# Patient Record
Sex: Male | Born: 1980 | Race: White | Hispanic: No | Marital: Married | Smoking: Current every day smoker
Health system: Southern US, Community
[De-identification: ages and names within clinical notes are randomized; demographics above are authoritative.]

## PROBLEM LIST (undated history)

## (undated) HISTORY — PX: MYRINGOTOMY: SUR874

---
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
## 2003-07-01 ENCOUNTER — Emergency Department (HOSPITAL_COMMUNITY): Admission: EM | Admit: 2003-07-01 | Discharge: 2003-07-01 | Payer: Self-pay | Admitting: Emergency Medicine

## 2003-09-18 ENCOUNTER — Emergency Department (HOSPITAL_COMMUNITY): Admission: EM | Admit: 2003-09-18 | Discharge: 2003-09-18 | Payer: Self-pay | Admitting: Family Medicine

## 2004-06-30 ENCOUNTER — Inpatient Hospital Stay (HOSPITAL_COMMUNITY): Admission: EM | Admit: 2004-06-30 | Discharge: 2004-07-02 | Payer: Self-pay | Admitting: Psychiatry

## 2004-06-30 ENCOUNTER — Encounter: Payer: Self-pay | Admitting: *Deleted

## 2004-06-30 ENCOUNTER — Ambulatory Visit: Payer: Self-pay | Admitting: Psychiatry

## 2004-07-09 ENCOUNTER — Emergency Department (HOSPITAL_COMMUNITY): Admission: EM | Admit: 2004-07-09 | Discharge: 2004-07-09 | Payer: Self-pay | Admitting: Emergency Medicine

## 2007-01-01 ENCOUNTER — Emergency Department (HOSPITAL_COMMUNITY): Admission: EM | Admit: 2007-01-01 | Discharge: 2007-01-01 | Payer: Self-pay | Admitting: Family Medicine

## 2007-01-09 ENCOUNTER — Emergency Department (HOSPITAL_COMMUNITY): Admission: EM | Admit: 2007-01-09 | Discharge: 2007-01-09 | Payer: Self-pay | Admitting: Emergency Medicine

## 2007-04-16 ENCOUNTER — Emergency Department (HOSPITAL_COMMUNITY): Admission: EM | Admit: 2007-04-16 | Discharge: 2007-04-16 | Payer: Self-pay | Admitting: Emergency Medicine

## 2008-04-22 ENCOUNTER — Emergency Department (HOSPITAL_COMMUNITY): Admission: EM | Admit: 2008-04-22 | Discharge: 2008-04-22 | Payer: Self-pay | Admitting: Emergency Medicine

## 2009-05-29 ENCOUNTER — Emergency Department (HOSPITAL_COMMUNITY): Admission: EM | Admit: 2009-05-29 | Discharge: 2009-05-30 | Payer: Self-pay | Admitting: Emergency Medicine

## 2010-11-05 LAB — URINALYSIS, ROUTINE W REFLEX MICROSCOPIC
Bilirubin Urine: NEGATIVE
Hgb urine dipstick: NEGATIVE
Ketones, ur: NEGATIVE mg/dL
Nitrite: NEGATIVE
Protein, ur: NEGATIVE mg/dL
Urobilinogen, UA: 0.2 mg/dL (ref 0.0–1.0)

## 2010-11-05 LAB — BASIC METABOLIC PANEL
Calcium: 9.7 mg/dL (ref 8.4–10.5)
GFR calc Af Amer: >60 mL/min (ref >60)
GFR calc non Af Amer: >60 mL/min (ref >60)
Glucose, Bld: 91 mg/dL (ref 70–99)
Potassium: 3.7 mEq/L (ref 3.5–5.1)
Sodium: 136 mEq/L (ref 135–145)

## 2010-11-05 LAB — CBC
Hemoglobin: 15.7 g/dL (ref 13.0–17.0)
RBC: 5.53 MIL/uL (ref 4.22–5.81)
RDW: 14.1 % (ref 11.5–15.5)
WBC: 7.5 10*3/uL (ref 4.0–10.5)

## 2010-11-05 LAB — DIFFERENTIAL
Basophils Absolute: 0 10*3/uL (ref 0.0–0.1)
Lymphocytes Relative: 24 % (ref 12–46)
Lymphs Abs: 1.8 10*3/uL (ref 0.7–4.0)
Monocytes Absolute: 0.7 10*3/uL (ref 0.1–1.0)
Neutro Abs: 4.9 10*3/uL (ref 1.7–7.7)

## 2010-11-05 LAB — ETHANOL: Alcohol, Ethyl (B): <5 mg/dL (ref 0–10)

## 2010-11-05 LAB — RAPID URINE DRUG SCREEN, HOSP PERFORMED
Amphetamines: NOT DETECTED
Tetrahydrocannabinol: POSITIVE — AB
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
## 2011-02-21 ENCOUNTER — Encounter: Payer: Self-pay | Admitting: *Deleted

## 2011-02-21 DIAGNOSIS — L0201 Cutaneous abscess of face: Secondary | ICD-10-CM | POA: Insufficient documentation

## 2011-02-21 DIAGNOSIS — L03211 Cellulitis of face: Secondary | ICD-10-CM | POA: Insufficient documentation

## 2011-02-21 NOTE — ED Notes (Signed)
Pt states that he has ?cysts to his neck and jaw area. Reddened area to right side of nose. Painful. CP x 1 month. BP fluctuating.

## 2011-02-22 ENCOUNTER — Encounter (HOSPITAL_BASED_OUTPATIENT_CLINIC_OR_DEPARTMENT_OTHER): Payer: Self-pay | Admitting: *Deleted

## 2011-02-22 ENCOUNTER — Emergency Department (HOSPITAL_BASED_OUTPATIENT_CLINIC_OR_DEPARTMENT_OTHER)
Admission: EM | Admit: 2011-02-22 | Discharge: 2011-02-22 | Disposition: A | Discharge status: Home / Self Care | Payer: Self-pay | Attending: Emergency Medicine | Admitting: Emergency Medicine

## 2011-02-22 DIAGNOSIS — L0201 Cutaneous abscess of face: Secondary | ICD-10-CM

## 2011-02-22 MED ORDER — DOXYCYCLINE HYCLATE 100 MG PO CAPS: 100.0000 mg | ORAL_CAPSULE | Freq: Two times a day (BID) | ORAL | Status: AC

## 2011-02-22 MED ORDER — DOXYCYCLINE HYCLATE 100 MG PO TABS
100.0000 mg | ORAL_TABLET | Freq: Once | ORAL | Status: AC
  Administered 2011-02-22: 100 mg via ORAL
  Filled 2011-02-22: qty 1

## 2011-02-22 NOTE — ED Provider Notes (Signed)
History     Chief Complaint  Patient presents with  . Abscess   The history is provided by the patient.   this is a 30 year old white male with a several day history of an abscess on the right side of his nose. It is accompanied by tender lymph nodes of the right chin and right anterior cervical regions. He has a history of oily skin and nipples but this abscess is worse than usual. Moderately tender to palpation. He has not taken any medication to treat it.   History reviewed. No pertinent past medical history.  History reviewed. No pertinent past surgical history.  History reviewed. No pertinent family history.  History  Substance Use Topics  . Smoking status: Current Everyday Smoker -- 1.0 packs/day  . Smokeless tobacco: Not on file  . Alcohol Use: Yes      Review of Systems   positive for erectile dysfunction, occasional hypertension as measured at home, a two-month history of intermittent fleeting sharp left lower chest pains lasting several seconds and acid reflux; other systems reviewed are negative  Physical Exam  BP 126/76  Pulse 84  Temp(Src) 98.6 F (37 C) (Oral)  Resp 20  Ht 5\' 10"  (1.778 m)  Wt 248 lb (112.492 kg)  BMI 35.58 kg/m2  SpO2 98%  Physical Exam General: Well-developed, well-nourished male in no acute distress; appearance consistent with age of record HENT: normocephalic, atraumatic Eyes: pupils equal round and reactive to light; extraocular muscles intact Neck: supple Heart: regular rate and rhythm; no murmurs, rubs or gallops Lungs: clear to auscultation bilaterally Abdomen: soft; nontender; nondistended; no masses or hepatosplenomegaly; bowel sounds present Extremities: No deformity; full range of motion; pulses normal Neurologic: Awake, alert and oriented;motor function intact in all extremities and symmetric;sensation grossly intact; no facial droop Skin: Warm and dry; early abscess right side of nose; nonfluctuant,  non-pointing Psychiatric: Normal mood and affect Lymph: Tender large lymph nodes of the right chin and right submandibular regions   ED Course  Procedures  MDM Abscess not ready for incision and drainage; will start patient on doxycycline and have her return in 48 hours if not improving. Patient was advised of importance of a primary care physician and that issue such as erectile dysfunction and blood pressure best managed by a PCP.      Hanley Seamen, MD 02/22/11 [phone]

## 2011-02-22 NOTE — ED Notes (Addendum)
Pt here with multiple complaints including erectile disfunction he thinks is due to elevated BP as well as swollen "cyst" in jaw CP intermittently x one month  abcess to face

## 2011-05-20 LAB — DIFFERENTIAL
Lymphocytes Relative: 7 — ABNORMAL LOW
Monocytes Absolute: 1.5 — ABNORMAL HIGH
Monocytes Relative: 8
Neutro Abs: 17.1 — ABNORMAL HIGH

## 2011-05-20 LAB — CBC
HCT: 39.7
Hemoglobin: 13.4
MCHC: 33.7
RBC: 4.66

## 2011-05-20 LAB — RAPID STREP SCREEN (MED CTR MEBANE ONLY): Streptococcus, Group A Screen (Direct): POSITIVE — AB

## 2011-06-28 ENCOUNTER — Encounter (HOSPITAL_BASED_OUTPATIENT_CLINIC_OR_DEPARTMENT_OTHER): Payer: Self-pay | Admitting: *Deleted

## 2011-06-28 ENCOUNTER — Emergency Department (INDEPENDENT_AMBULATORY_CARE_PROVIDER_SITE_OTHER): Payer: Self-pay

## 2011-06-28 ENCOUNTER — Emergency Department (HOSPITAL_BASED_OUTPATIENT_CLINIC_OR_DEPARTMENT_OTHER)
Admission: EM | Admit: 2011-06-28 | Discharge: 2011-06-28 | Disposition: A | Discharge status: Home / Self Care | Payer: Self-pay | Attending: Emergency Medicine | Admitting: Emergency Medicine

## 2011-06-28 DIAGNOSIS — R05 Cough: Secondary | ICD-10-CM

## 2011-06-28 DIAGNOSIS — R112 Nausea with vomiting, unspecified: Secondary | ICD-10-CM | POA: Insufficient documentation

## 2011-06-28 DIAGNOSIS — R509 Fever, unspecified: Secondary | ICD-10-CM

## 2011-06-28 DIAGNOSIS — R059 Cough, unspecified: Secondary | ICD-10-CM | POA: Insufficient documentation

## 2011-06-28 MED ORDER — ONDANSETRON HCL 8 MG PO TABS: 8.0000 mg | ORAL_TABLET | Freq: Three times a day (TID) | ORAL | Status: AC | PRN

## 2011-06-28 MED ORDER — ONDANSETRON 8 MG PO TBDP
8.0000 mg | ORAL_TABLET | Freq: Once | ORAL | Status: AC
  Administered 2011-06-28: 8 mg via ORAL
  Filled 2011-06-28: qty 1

## 2011-06-28 NOTE — ED Notes (Signed)
Pt reports n/v and productive green cough since Thursday with intermitt fever.

## 2011-06-28 NOTE — ED Provider Notes (Signed)
History     CSN: [id number] Arrival date & time: 06/28/2011  8:18 AM   None     Chief Complaint  Patient presents with  . Cough  . Nausea  . Emesis    (Consider location/radiation/quality/duration/timing/severity/associated sxs/prior treatment) Patient is a 30 y.o. male presenting with cough and vomiting.  Cough Pertinent negatives include no chest pain, no headaches, no shortness of breath and no eye redness.  Emesis  Associated symptoms include cough. Pertinent negatives include no abdominal pain, no fever and no headaches.   Pt c/o productive cough for past 4 days. Also notes intermittent nvd. Emesis clear, not bloody or bilious. Diarrhea watery. Sig others child w recent 'sinus infection'. No other known ill contacts. No recent abx use. Says diarrhea seems a bit better today. Cough persists. Smoker. Subjective fever. No sore throat or sinus drainage/pain. No headache. No rash.  No hx asthma. States sent home from work today, needs note.     No past medical history on file.  No past surgical history on file.  No family history on file.  History  Substance Use Topics  . Smoking status: Current Everyday Smoker -- 1.0 packs/day  . Smokeless tobacco: Not on file  . Alcohol Use: Yes      Review of Systems  Constitutional: Negative for fever.  HENT: Negative for neck pain.   Eyes: Negative for redness.  Respiratory: Positive for cough. Negative for shortness of breath.   Cardiovascular: Negative for chest pain.  Gastrointestinal: Positive for vomiting. Negative for abdominal pain.  Genitourinary: Negative for flank pain.  Musculoskeletal: Negative for back pain.  Skin: Negative for rash.  Neurological: Negative for headaches.  Hematological: Does not bruise/bleed easily.  Psychiatric/Behavioral: Negative for confusion.    Allergies  Review of patient's allergies indicates no known allergies.  Home Medications  No current outpatient prescriptions on  file.  There were no vitals taken for this visit.  Physical Exam  Nursing note and vitals reviewed. Constitutional: He is oriented to person, place, and time. He appears well-developed and well-nourished. No distress.  HENT:  Head: Atraumatic.  Eyes: Pupils are equal, round, and reactive to light.  Neck: Neck supple. No tracheal deviation present.       No stiffness, rigidity  Cardiovascular: Normal rate, regular rhythm, normal heart sounds and intact distal pulses.  Exam reveals no gallop and no friction rub.   No murmur heard. Pulmonary/Chest: Effort normal. No accessory muscle usage. No respiratory distress. He has no wheezes.       Upper resp congestion  Abdominal: Soft. Bowel sounds are normal. He exhibits no distension and no mass. There is no tenderness. There is no rebound and no guarding.       No hsm  Genitourinary:       No cva tenderness  Musculoskeletal: Normal range of motion. He exhibits no edema and no tenderness.  Lymphadenopathy:    He has no cervical adenopathy.  Neurological: He is alert and oriented to person, place, and time.  Skin: Skin is warm and dry. No rash noted.  Psychiatric: He has a normal mood and affect.    ED Course  Procedures (including critical care time)  Labs Reviewed - No data to display No results found. Dg Chest 2 View  06/28/2011  *RADIOLOGY REPORT*  Clinical Data: Cough, fever  CHEST - 2 VIEW  Comparison:  06/30/2004  Findings:  The heart size and mediastinal contours are within normal limits.  Both lungs are clear.  The visualized skeletal structures are unremarkable.  IMPRESSION: No active cardiopulmonary disease.  Original Report Authenticated By: Judie Petit. Ruel Favors, M.D.    No diagnosis found.    MDM  Cxr. zofran po.   Recheck no increased wob. No emesis in ed.       Suzi Roots, MD 06/28/11 [phone]

## 2012-03-07 ENCOUNTER — Encounter (HOSPITAL_BASED_OUTPATIENT_CLINIC_OR_DEPARTMENT_OTHER): Payer: Self-pay | Admitting: *Deleted

## 2012-03-07 ENCOUNTER — Emergency Department (HOSPITAL_BASED_OUTPATIENT_CLINIC_OR_DEPARTMENT_OTHER)
Admission: EM | Admit: 2012-03-07 | Discharge: 2012-03-07 | Disposition: A | Discharge status: Home / Self Care | Payer: Self-pay | Attending: Emergency Medicine | Admitting: Emergency Medicine

## 2012-03-07 DIAGNOSIS — X500XXA Overexertion from strenuous movement or load, initial encounter: Secondary | ICD-10-CM | POA: Insufficient documentation

## 2012-03-07 DIAGNOSIS — T148XXA Other injury of unspecified body region, initial encounter: Secondary | ICD-10-CM

## 2012-03-07 DIAGNOSIS — M545 Low back pain: Secondary | ICD-10-CM

## 2012-03-07 DIAGNOSIS — F17200 Nicotine dependence, unspecified, uncomplicated: Secondary | ICD-10-CM | POA: Insufficient documentation

## 2012-03-07 DIAGNOSIS — S335XXA Sprain of ligaments of lumbar spine, initial encounter: Secondary | ICD-10-CM | POA: Insufficient documentation

## 2012-03-07 MED ORDER — CYCLOBENZAPRINE HCL 10 MG PO TABS: 10.0000 mg | ORAL_TABLET | Freq: Two times a day (BID) | ORAL | Status: AC | PRN

## 2012-03-07 MED ORDER — IBUPROFEN 800 MG PO TABS: 800.0000 mg | ORAL_TABLET | Freq: Three times a day (TID) | ORAL | Status: AC

## 2012-03-07 NOTE — ED Provider Notes (Signed)
History     CSN: [id number]  Arrival date & time 03/07/12  6440   First MD Initiated Contact with Patient 03/07/12 1944      Chief Complaint  Patient presents with  . Back Pain    (Consider location/radiation/quality/duration/timing/severity/associated sxs/prior treatment) Patient is a 31 y.o. male presenting with back pain. The history is provided by the patient.  Back Pain  This is a new problem. The current episode started more than 2 days ago. The problem occurs constantly. The pain is associated with lifting heavy objects. The pain does not radiate. Pertinent negatives include no fever, no abdominal pain, no perianal numbness, no bladder incontinence and no paresthesias. Associated symptoms comments: Lower back pain after heavy lifting that is worse with movement and better in certain position. No numbness or extremity weakness. Marland Kitchen    History reviewed. No pertinent past medical history.  History reviewed. No pertinent past surgical history.  History reviewed. No pertinent family history.  History  Substance Use Topics  . Smoking status: Current Everyday Smoker -- 1.0 packs/day  . Smokeless tobacco: Not on file  . Alcohol Use: Yes      Review of Systems  Constitutional: Negative for fever and chills.  HENT: Negative.   Respiratory: Negative.   Cardiovascular: Negative.   Gastrointestinal: Negative.  Negative for abdominal pain.  Genitourinary: Negative for bladder incontinence.  Musculoskeletal: Positive for back pain.       See HPI  Skin: Negative.   Neurological: Negative.  Negative for paresthesias.    Allergies  Review of patient's allergies indicates no known allergies.  Home Medications   Current Outpatient Rx  Name Route Sig Dispense Refill  . IBUPROFEN 200 MG PO TABS Oral Take 600 mg by mouth every 6 (six) hours as needed. For pain.      BP 126/73  Pulse 104  Temp 98.6 F (37 C) (Oral)  Resp 16  Ht 5\' 11"  (1.803 m)  Wt 250 lb (113.399 kg)   BMI 34.87 kg/m2  SpO2 100%  Physical Exam  Constitutional: He is oriented to person, place, and time. He appears well-developed and well-nourished.  Neck: Normal range of motion.  Pulmonary/Chest: Effort normal.  Abdominal: Soft. He exhibits no mass. There is no tenderness.  Musculoskeletal: Normal range of motion. He exhibits no edema.       Right paralumbar tenderness without swelling, discoloration. No sciatic tenderness on right. Distal pulses 2+.  Neurological: He is alert and oriented to person, place, and time. He has normal reflexes. No sensory deficit.  Skin: Skin is warm and dry.  Psychiatric: He has a normal mood and affect.    ED Course  Procedures (including critical care time)  Labs Reviewed - No data to display No results found.   No diagnosis found.  1. Low back pain 2. Muscle strain   MDM  No evidence of neurologic deficits, suspect muscular strain.        Rodena Medin, PA-C 03/07/12 2039

## 2012-03-07 NOTE — ED Notes (Signed)
Pt c/o lower back pain after lifting heavy objects

## 2012-03-08 NOTE — ED Provider Notes (Signed)
Medical screening examination/treatment/procedure(s) were performed by non-physician practitioner and as supervising physician I was immediately available for consultation/collaboration.  Cyndra Numbers, MD 03/08/12 1431

## 2012-05-09 ENCOUNTER — Emergency Department (HOSPITAL_COMMUNITY)
Admission: EM | Admit: 2012-05-09 | Discharge: 2012-05-09 | Disposition: A | Discharge status: Home / Self Care | Payer: No Typology Code available for payment source | Attending: Emergency Medicine | Admitting: Emergency Medicine

## 2012-05-09 ENCOUNTER — Emergency Department (HOSPITAL_COMMUNITY): Payer: Self-pay

## 2012-05-09 ENCOUNTER — Encounter (HOSPITAL_COMMUNITY): Payer: Self-pay | Admitting: Neurology

## 2012-05-09 DIAGNOSIS — M25519 Pain in unspecified shoulder: Secondary | ICD-10-CM | POA: Insufficient documentation

## 2012-05-09 DIAGNOSIS — S59909A Unspecified injury of unspecified elbow, initial encounter: Secondary | ICD-10-CM | POA: Insufficient documentation

## 2012-05-09 DIAGNOSIS — S43109A Unspecified dislocation of unspecified acromioclavicular joint, initial encounter: Secondary | ICD-10-CM | POA: Insufficient documentation

## 2012-05-09 DIAGNOSIS — F17200 Nicotine dependence, unspecified, uncomplicated: Secondary | ICD-10-CM | POA: Insufficient documentation

## 2012-05-09 DIAGNOSIS — S6990XA Unspecified injury of unspecified wrist, hand and finger(s), initial encounter: Secondary | ICD-10-CM | POA: Insufficient documentation

## 2012-05-09 MED ORDER — HYDROMORPHONE HCL PF 1 MG/ML IJ SOLN
1.0000 mg | Freq: Once | INTRAMUSCULAR | Status: AC
  Administered 2012-05-09: 1 mg via INTRAVENOUS
  Filled 2012-05-09: qty 1

## 2012-05-09 MED ORDER — HYDROCODONE-ACETAMINOPHEN 5-325 MG PO TABS: 1.0000 | ORAL_TABLET | Freq: Four times a day (QID) | ORAL | Status: AC | PRN
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
## 2012-05-09 NOTE — Progress Notes (Signed)
Orthopedic Tech Progress Note Patient Details:  Craig Cortez June 10, 1981 [id number] Verbal order for arm sling for patient from responding Trauma doctor in ER. Arm sling partially applied to Right UE (forearm in sling, straps not fastened so that Radiology can get to areas needed for xray). Nurse to fasten straps to arm sling after xray. Ortho Devices Type of Ortho Device: Arm foam sling Ortho Device/Splint Interventions: Ordered   [place] R Thompson 05/09/2012, 10:24 AM

## 2012-05-09 NOTE — ED Notes (Signed)
Pt returned from xray

## 2012-05-09 NOTE — ED Notes (Signed)
Pt removed c-collar himself. Pt refusing to allow c-collar to be reapplied. Reporting pain has improved some. Waiting for xray results

## 2012-05-09 NOTE — ED Notes (Signed)
Patient transported to X-ray 

## 2012-05-10 NOTE — ED Provider Notes (Signed)
I saw and evaluated the patient, reviewed the resident's note and I agree with the findings and plan.  Abdomen benign. Pelvis normal. Full ROM of bilateral ankle, knees, and hips. cxr and c spine without radiographic abnormality. Only complaint is right shoulder pain. Xray consistent with AC joint separation. Sling and orthopedic follow up.   1. Acromioclavicular joint separation     I personally reviewed the imaging tests through PACS system  I reviewed available ER/hospitalization records thought the EMR Dg Chest 1 View  05/09/2012  *RADIOLOGY REPORT*  Clinical Data: Trauma.  CHEST - 1 VIEW  Comparison: 06/28/2011  Findings: Lungs are clear.  No consolidation, pneumothorax or pleural fluid is identified.  Cardiac and mediastinal contours are normal.  No fractures are evident.  IMPRESSION: No active disease in the chest.   Original Report Authenticated By: Reola Calkins, M.D.    Dg Cervical Spine 2-3 Views  05/09/2012  *RADIOLOGY REPORT*  Clinical Data: Trauma.  CERVICAL SPINE - 2-3 VIEW  Comparison: None.  Findings: Frontal and lateral projections show grossly normal alignment of the cervical spine.  However, the lower cervical region below the mid C6 level is not visualized by radiography.  No soft tissue swelling is identified.  IMPRESSION: Limited evaluation of the cervical spine which is not considered fully cleared by radiography.  No gross injuries identified up to roughly the mid C6 level.   Original Report Authenticated By: Reola Calkins, M.D.    Dg Shoulder Right  05/09/2012  *RADIOLOGY REPORT*  Clinical Data: Trauma.  Right shoulder pain.  RIGHT SHOULDER - 2+ VIEW  Comparison: None.  Findings: There is evidence of AC joint sprain and separation with widening of the [facility] joint and mild elevation of the distal clavicle relative to the acromion.  No visible fracture.  The glenohumeral joint shows normal alignment.  The scapula and visualized upper ribs appear intact.  Soft tissue  swelling present without visible soft tissue foreign body.  IMPRESSION: AC joint injury with widening and separation of the [facility] joint but no visible fracture.   Original Report Authenticated By: Reola Calkins, M.D.    Dg Humerus Right  05/09/2012  *RADIOLOGY REPORT*  Clinical Data: Trauma.  RIGHT HUMERUS - 2+ VIEW  Comparison: None.  Findings: No fracture of the humerus is identified.  Soft tissues are unremarkable.  IMPRESSION: No acute fracture of the right humerus.   Original Report Authenticated By: Reola Calkins, M.D.      Lyanne Co, MD 05/10/12 [phone]

## 2012-05-12 ENCOUNTER — Other Ambulatory Visit (HOSPITAL_COMMUNITY): Payer: Self-pay | Admitting: Orthopaedic Surgery

## 2012-05-12 ENCOUNTER — Ambulatory Visit (HOSPITAL_COMMUNITY)
Admission: RE | Admit: 2012-05-12 | Discharge: 2012-05-12 | Disposition: A | Discharge status: Home / Self Care | Payer: Self-pay | Source: Ambulatory Visit | Attending: Orthopaedic Surgery | Admitting: Orthopaedic Surgery

## 2012-05-12 DIAGNOSIS — S52123A Displaced fracture of head of unspecified radius, initial encounter for closed fracture: Secondary | ICD-10-CM | POA: Insufficient documentation

## 2012-05-12 DIAGNOSIS — M25521 Pain in right elbow: Secondary | ICD-10-CM

## 2012-05-12 DIAGNOSIS — X58XXXA Exposure to other specified factors, initial encounter: Secondary | ICD-10-CM | POA: Insufficient documentation

## 2013-02-25 IMAGING — CT CT ELBOW*R* W/O CM
2 of 3 series · 7 of 35 positions shown, 8 images · non-contrast
Comparison: Radiographs of the shoulder and humerus 05/09/2012.

CLINICAL DATA: Aujla injury with dislocated AC joint and elbow
pain.  Question radial head fracture.

CT OF THE RIGHT ELBOW WITHOUT CONTRAST
TECHNIQUE: Multidetector CT imaging was performed according to the
standard protocol. Multiplanar CT image reconstructions were also
generated.

[Series 4: soft tissue · axial · 0.25mm/px · z∈[-254,-142]mm · 4 of 65 slices shown, 5 images]
[im 10/65  soft-tissue]
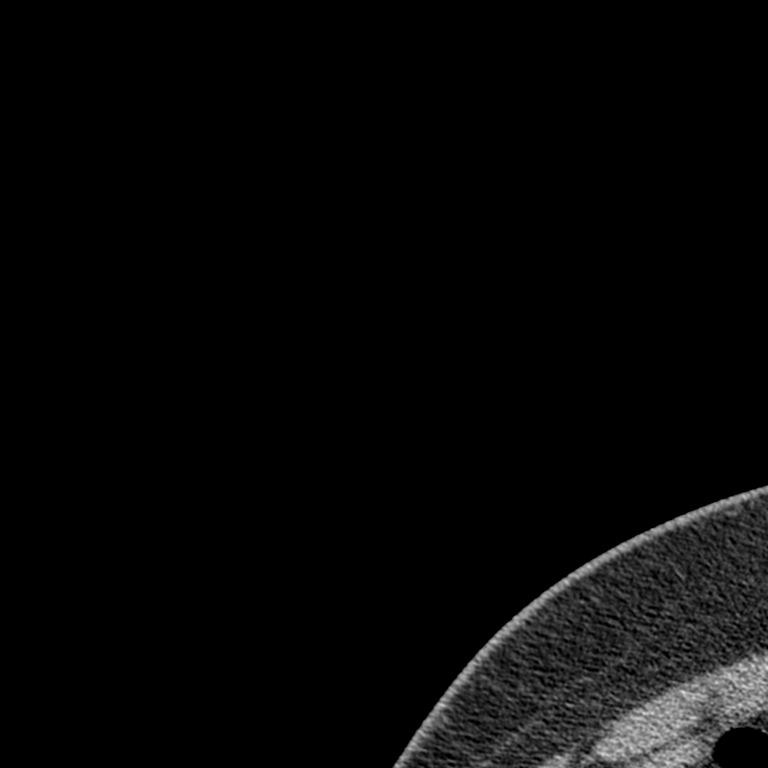
[im 10/65  bone]
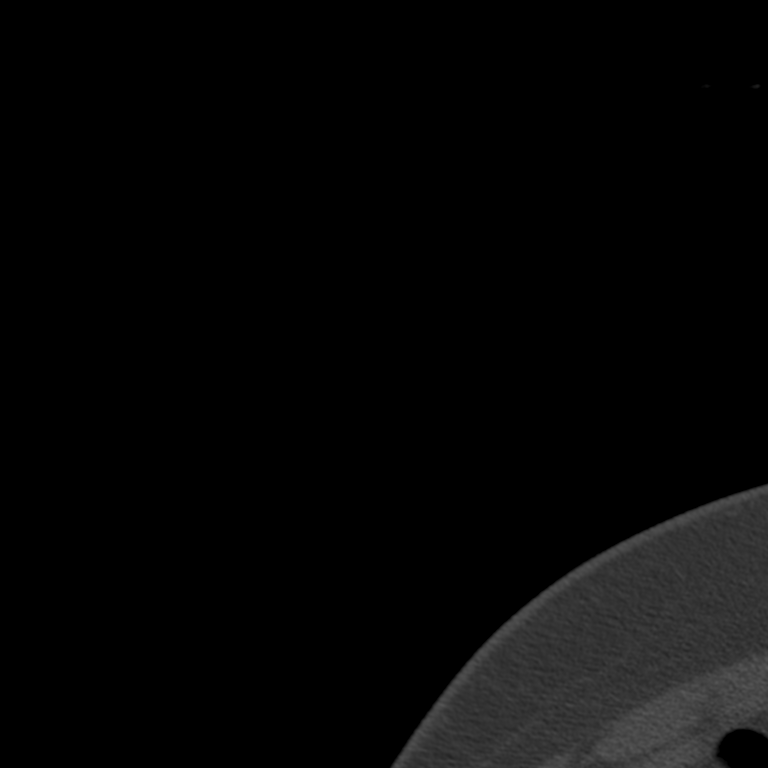
[im 25/65  bone]
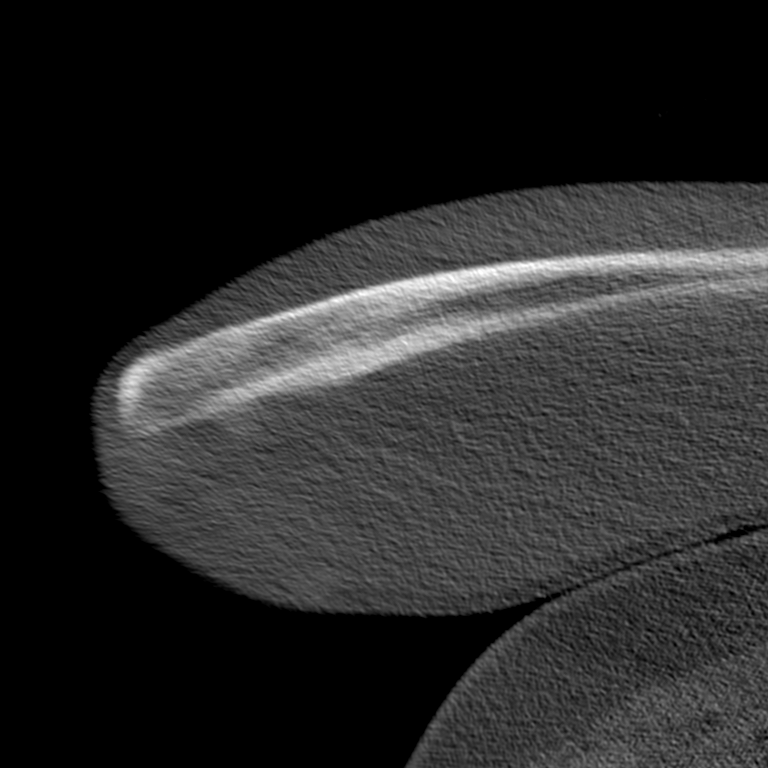
[im 40/65  bone]
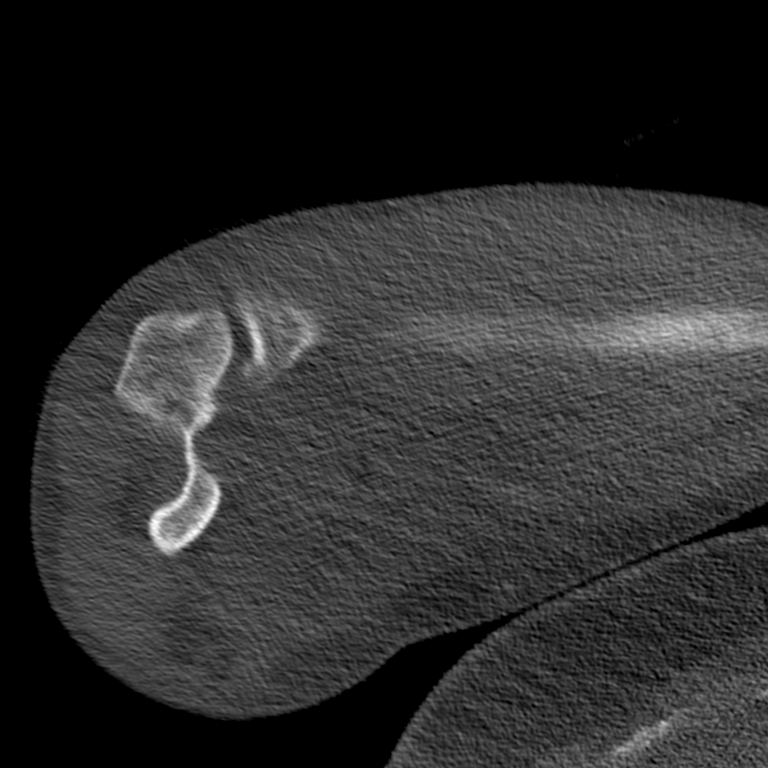
[im 55/65  bone]
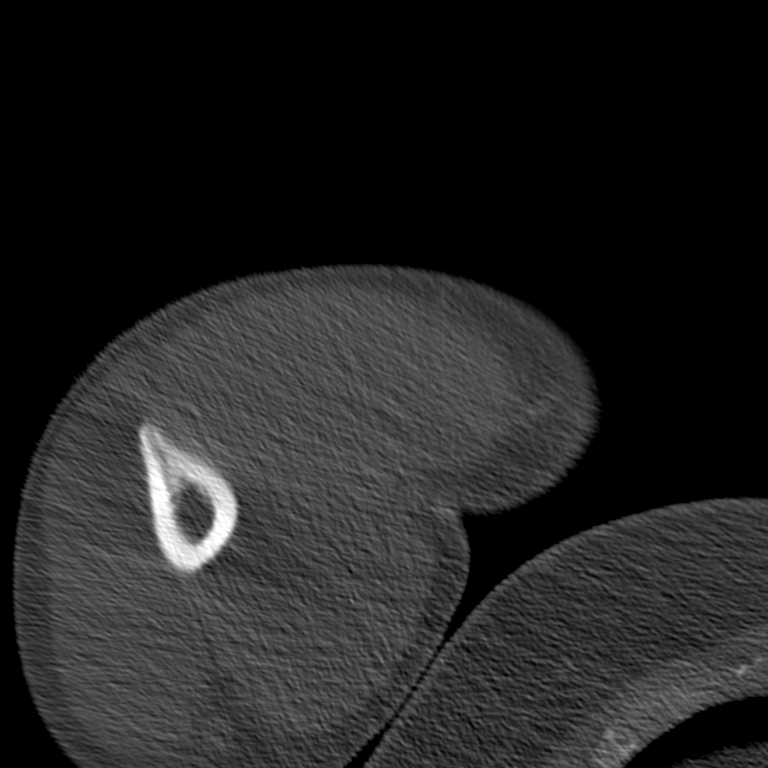

[sagittals · coronal · 0.25mm/px · 3 of 40 slices shown]
[im 8/40  bone]
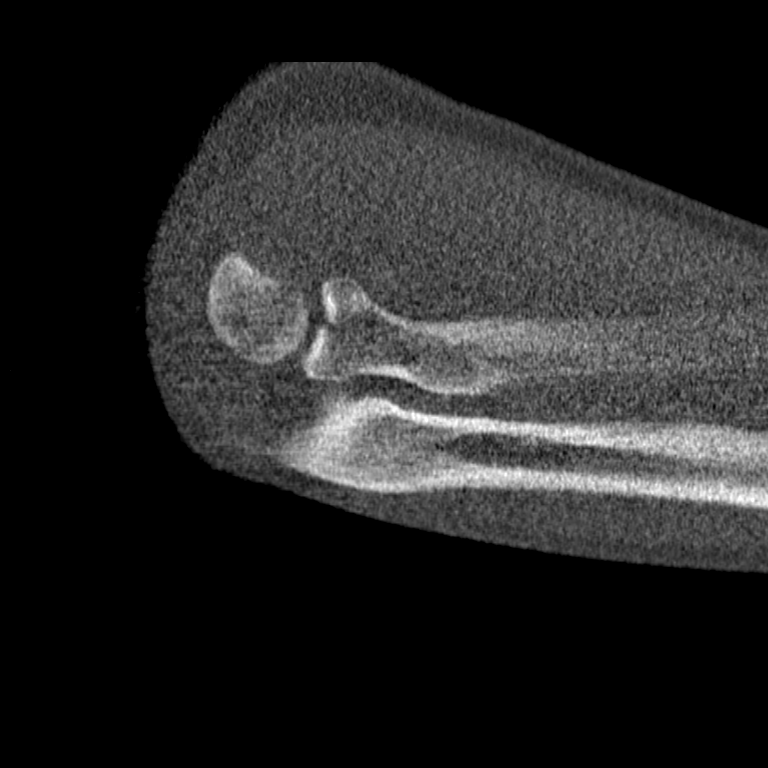
[im 16/40  bone]
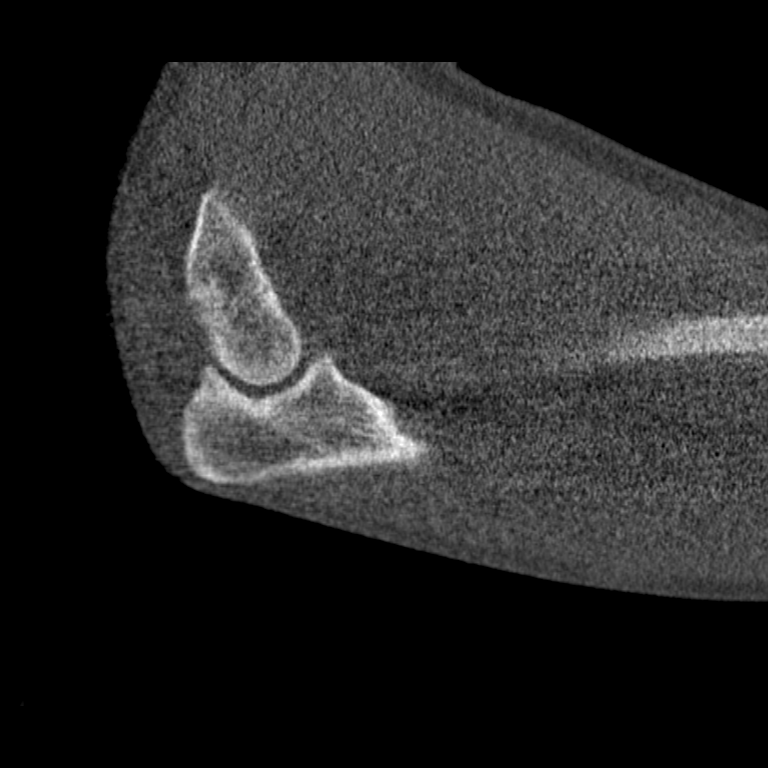
[im 24/40  bone]
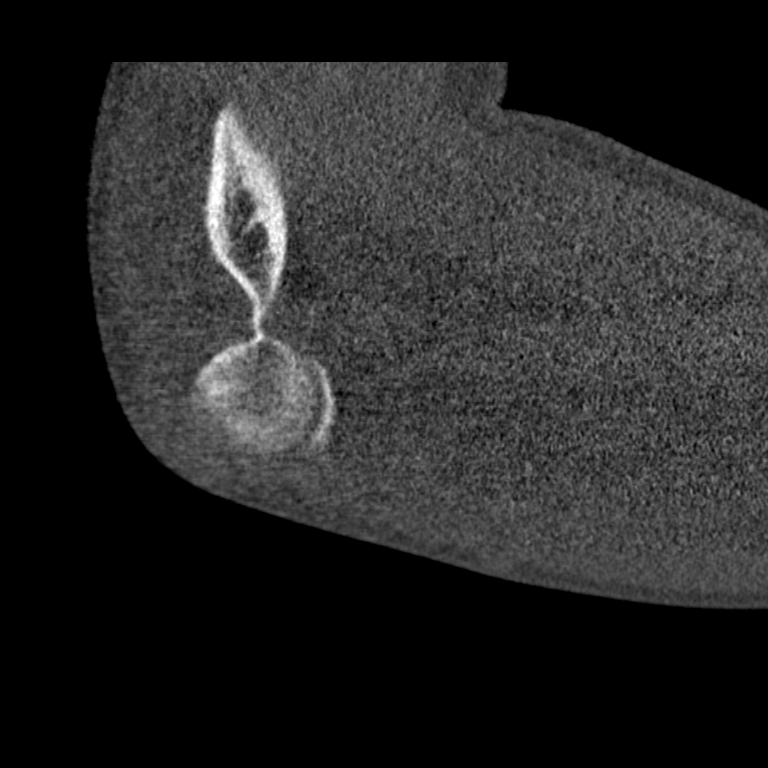

[7 of 35 positions shown; findings below may reference images not displayed]

FINDINGS: Because of the AC joint injury, the patient could not
raise his arm above his head.  The study was performed with the
arms by the patient's size.  There is resulting streak artifact and
limited resolution.

There is an intra-articular fracture involving the radial head
anteriorly.  This demonstrates up to 2.3 mm of cortical step off on
the sagittally reformatted images.  No displaced intra-articular
fracture fragment is seen.  There is no evidence of capitellar
fracture or dislocation.  The distal radius and proximal ulna
appear intact.
IMPRESSION: 1.  Mildly displaced intra-articular fracture of the radial head.
2.  No other fracture, dislocation or intra-articular fracture
fragment identified.

## 2015-03-27 ENCOUNTER — Encounter (HOSPITAL_BASED_OUTPATIENT_CLINIC_OR_DEPARTMENT_OTHER): Payer: Self-pay | Admitting: *Deleted

## 2015-03-27 ENCOUNTER — Emergency Department (HOSPITAL_BASED_OUTPATIENT_CLINIC_OR_DEPARTMENT_OTHER)
Admission: EM | Admit: 2015-03-27 | Discharge: 2015-03-27 | Disposition: A | Discharge status: Home / Self Care | Payer: PRIVATE HEALTH INSURANCE | Attending: Emergency Medicine | Admitting: Emergency Medicine

## 2015-03-27 DIAGNOSIS — Y9389 Activity, other specified: Secondary | ICD-10-CM | POA: Insufficient documentation

## 2015-03-27 DIAGNOSIS — Y998 Other external cause status: Secondary | ICD-10-CM | POA: Diagnosis not present

## 2015-03-27 DIAGNOSIS — Z79899 Other long term (current) drug therapy: Secondary | ICD-10-CM | POA: Diagnosis not present

## 2015-03-27 DIAGNOSIS — Z720 Tobacco use: Secondary | ICD-10-CM | POA: Diagnosis not present

## 2015-03-27 DIAGNOSIS — S39012A Strain of muscle, fascia and tendon of lower back, initial encounter: Secondary | ICD-10-CM

## 2015-03-27 DIAGNOSIS — X58XXXA Exposure to other specified factors, initial encounter: Secondary | ICD-10-CM | POA: Diagnosis not present

## 2015-03-27 DIAGNOSIS — S3992XA Unspecified injury of lower back, initial encounter: Secondary | ICD-10-CM | POA: Diagnosis present

## 2015-03-27 DIAGNOSIS — Y9289 Other specified places as the place of occurrence of the external cause: Secondary | ICD-10-CM | POA: Insufficient documentation

## 2015-03-27 MED ORDER — CYCLOBENZAPRINE HCL 10 MG PO TABS: 10.0000 mg | ORAL_TABLET | Freq: Two times a day (BID) | ORAL | Status: AC | PRN

## 2015-03-27 MED ORDER — NAPROXEN 500 MG PO TABS: 500.0000 mg | ORAL_TABLET | Freq: Two times a day (BID) | ORAL | Status: AC

## 2015-03-27 MED ORDER — KETOROLAC TROMETHAMINE 60 MG/2ML IM SOLN
60.0000 mg | Freq: Once | INTRAMUSCULAR | Status: AC
  Administered 2015-03-27: 60 mg via INTRAMUSCULAR
  Filled 2015-03-27: qty 2

## 2015-03-27 MED ORDER — LIDOCAINE 5 % EX PTCH: 1.0000 | MEDICATED_PATCH | CUTANEOUS | Status: AC

## 2015-03-27 NOTE — ED Provider Notes (Addendum)
CSN: [id number]     Arrival date & time 03/27/15  4098 History   First MD Initiated Contact with Patient 03/27/15 [phone]     Chief Complaint  Patient presents with  . Back Pain     (Consider location/radiation/quality/duration/timing/severity/associated sxs/prior Treatment) Patient is a 34 y.o. male presenting with back pain. The history is provided by the patient.  Back Pain Location:  Lumbar spine Quality:  Aching, cramping, shooting and stiffness Stiffness is present:  All day Radiates to:  Does not radiate Pain severity:  Severe Pain is:  Worse during the day Onset quality:  Sudden Duration:  12 hours Timing:  Constant Progression:  Worsening Chronicity:  New Context comment:  States yesterday he was moving some furniture and was fine and then a short while later he bent over to pick up some cloths and pain shot through the right side of his back dropping him to his knees Relieved by:  Nothing Worsened by:  Movement, bending, ambulation and twisting Ineffective treatments: flexeril from his mom. Associated symptoms: no abdominal pain, no bladder incontinence, no bowel incontinence, no fever, no leg pain, no numbness, no tingling and no weakness   Risk factors: no hx of cancer, no recent surgery and no steroid use   Risk factors comment:  No IVDU   History reviewed. No pertinent past medical history. Past Surgical History  Procedure Laterality Date  . Myringotomy     No family history on file. Social History  Substance Use Topics  . Smoking status: Current Every Day Smoker -- 1.00 packs/day  . Smokeless tobacco: Current User  . Alcohol Use: Yes     Comment: social     Review of Systems  Constitutional: Negative for fever.  Gastrointestinal: Negative for abdominal pain and bowel incontinence.  Genitourinary: Negative for bladder incontinence.  Musculoskeletal: Positive for back pain.  Neurological: Negative for tingling, weakness and numbness.  All other systems  reviewed and are negative.     Allergies  Review of patient's allergies indicates no known allergies.  Home Medications   Prior to Admission medications   Medication Sig Start Date End Date Taking? Authorizing Provider  sertraline (ZOLOFT) 100 MG tablet Take 100 mg by mouth daily.   Yes Historical Provider, MD  HYDROcodone-acetaminophen (NORCO/VICODIN) 5-325 MG per tablet Take 1-2 tablets by mouth every 6 (six) hours as needed for pain. 05/09/12   Lara Mulch, MD   BP 132/92 mmHg  Pulse 97  Temp(Src) 97.9 F (36.6 C) (Oral)  Resp 20  Ht  (1.803 m)  Wt 268 lb (121.564 kg)  BMI 37.39 kg/m2  SpO2 100% Physical Exam  Constitutional: He is oriented to person, place, and time. He appears well-developed and well-nourished. No distress.  HENT:  Head: Normocephalic and atraumatic.  Mouth/Throat: Oropharynx is clear and moist.  Eyes: EOM are normal. Pupils are equal, round, and reactive to light.  Neck: Normal range of motion. Neck supple. No spinous process tenderness and no muscular tenderness present. No rigidity. Normal range of motion present.  Cardiovascular: Normal rate, regular rhythm, normal heart sounds and intact distal pulses.   No murmur heard. Pulmonary/Chest: Effort normal and breath sounds normal. He has no wheezes. He has no rales.  Abdominal: Soft. He exhibits no distension. There is no tenderness. There is no CVA tenderness.  Musculoskeletal:       Lumbar back: He exhibits decreased range of motion, tenderness, pain and spasm. He exhibits no bony tenderness, no swelling, no deformity and normal  pulse.       Back:  Positive straight leg raise on the right only  Neurological: He is alert and oriented to person, place, and time. He has normal strength. No sensory deficit. Coordination normal.  Reflex Scores:      Patellar reflexes are 1+ on the right side and 1+ on the left side. Skin: Skin is warm and dry. No rash noted.  Psychiatric: He has a normal mood and  affect.  Nursing note and vitals reviewed.   ED Course  Procedures (including critical care time) Labs Review Labs Reviewed - No data to display  Imaging Review No results found. I have personally reviewed and evaluated these images and lab results as part of my medical decision-making.   EKG Interpretation None      MDM   Final diagnoses:  Lumbar strain, initial encounter    Patient with symptoms most consistent with right-sided back strain. It occurred yesterday after bending over. No radiation down the leg but when he moves his right leg the pain in his back is worse. He took his mom's Flexeril last night which relaxed everything except the area that was hurting. He has not taken any Tylenol or NSAIDs. He has issues with chronic mild back pain that he does not seek medical care for but nothing has been this bad. He denies any bowel or bladder changes and no history of IV drug abuse.  Patient given IM Toradol here and given a pressure fraction for naproxen and Flexeril.  Gwyneth Sprout, MD 03/27/15 4098  Gwyneth Sprout, MD 03/27/15 [phone]

## 2015-03-27 NOTE — ED Notes (Signed)
Patient states he was moving some items around yesterday and had no problem with his back.  A short while later, he bend down to pick up a basket of clothes when his back gave out and he fell.  C/O pain in the right lower back with radiation down the right leg.  States when he puts pressure on his heels.

## 2017-11-29 ENCOUNTER — Other Ambulatory Visit: Payer: Self-pay

## 2017-11-29 ENCOUNTER — Emergency Department (HOSPITAL_BASED_OUTPATIENT_CLINIC_OR_DEPARTMENT_OTHER)
Admission: EM | Admit: 2017-11-29 | Discharge: 2017-11-29 | Disposition: A | Discharge status: Home / Self Care | Payer: PRIVATE HEALTH INSURANCE | Attending: Emergency Medicine | Admitting: Emergency Medicine

## 2017-11-29 ENCOUNTER — Encounter (HOSPITAL_BASED_OUTPATIENT_CLINIC_OR_DEPARTMENT_OTHER): Payer: Self-pay | Admitting: Emergency Medicine

## 2017-11-29 DIAGNOSIS — Y929 Unspecified place or not applicable: Secondary | ICD-10-CM | POA: Insufficient documentation

## 2017-11-29 DIAGNOSIS — S0501XA Injury of conjunctiva and corneal abrasion without foreign body, right eye, initial encounter: Secondary | ICD-10-CM | POA: Insufficient documentation

## 2017-11-29 DIAGNOSIS — F17200 Nicotine dependence, unspecified, uncomplicated: Secondary | ICD-10-CM | POA: Insufficient documentation

## 2017-11-29 DIAGNOSIS — W228XXA Striking against or struck by other objects, initial encounter: Secondary | ICD-10-CM | POA: Insufficient documentation

## 2017-11-29 DIAGNOSIS — Y999 Unspecified external cause status: Secondary | ICD-10-CM | POA: Insufficient documentation

## 2017-11-29 DIAGNOSIS — S0500XA Injury of conjunctiva and corneal abrasion without foreign body, unspecified eye, initial encounter: Secondary | ICD-10-CM

## 2017-11-29 DIAGNOSIS — Y939 Activity, unspecified: Secondary | ICD-10-CM | POA: Insufficient documentation

## 2017-11-29 MED ORDER — ACETAMINOPHEN 500 MG PO TABS
1000.0000 mg | ORAL_TABLET | Freq: Once | ORAL | Status: AC
  Administered 2017-11-29: 1000 mg via ORAL
  Filled 2017-11-29: qty 2

## 2017-11-29 MED ORDER — TETRACAINE HCL 0.5 % OP SOLN
2.0000 [drp] | Freq: Once | OPHTHALMIC | Status: AC
  Administered 2017-11-29: 2 [drp] via OPHTHALMIC
  Filled 2017-11-29: qty 4

## 2017-11-29 MED ORDER — FLUORESCEIN SODIUM 1 MG OP STRP
1.0000 | ORAL_STRIP | Freq: Once | OPHTHALMIC | Status: AC
  Administered 2017-11-29: 1 via OPHTHALMIC
  Filled 2017-11-29: qty 1

## 2017-11-29 MED ORDER — CIPROFLOXACIN HCL 0.3 % OP SOLN: 2.0000 [drp] | OPHTHALMIC | 0 refills | Status: AC

## 2017-11-29 NOTE — ED Provider Notes (Signed)
[facility] EMERGENCY DEPARTMENT Provider Note   CSN: [id number] Arrival date & time: 11/29/17  0255     History   Chief Complaint Chief Complaint  Patient presents with  . Eye Pain    HPI Craig Cortez is a 37 y.o. male.  The history is provided by the patient.  Eye Pain  This is a new problem. The current episode started 12 to 24 hours ago. The problem occurs constantly. The problem has not changed since onset.Pertinent negatives include no chest pain, no abdominal pain, no headaches and no shortness of breath. Nothing aggravates the symptoms. Nothing relieves the symptoms. He has tried nothing for the symptoms. The treatment provided no relief.  Was working in a crawl space at lunchtime yesterday and something flew in the patient's right eye.  Patient states his wife looked and did not see any foreign body but symptoms persisted.  No contacts.    History reviewed. No pertinent past medical history.  There are no active problems to display for this patient.   Past Surgical History:  Procedure Laterality Date  . MYRINGOTOMY          Home Medications    Prior to Admission medications   Medication Sig Start Date End Date Taking? Authorizing Provider  ciprofloxacin (CILOXAN) 0.3 % ophthalmic solution Place 2 drops into both eyes every 4 (four) hours while awake. Administer 1 drop, every 2 hours, while awake, for 2 days. Then 1 drop, every 4 hours, while awake, for the next 5 days. 11/29/17   Cannon Quinton, MD  cyclobenzaprine (FLEXERIL) 10 MG tablet Take 1 tablet (10 mg total) by mouth 2 (two) times daily as needed for muscle spasms. 03/27/15   Gwyneth Sprout, MD  HYDROcodone-acetaminophen (NORCO/VICODIN) 5-325 MG per tablet Take 1-2 tablets by mouth every 6 (six) hours as needed for pain. 05/09/12   Lara Mulch, MD  lidocaine (LIDODERM) 5 % Place 1 patch onto the skin daily. Remove & Discard patch within 12 hours or as directed by MD 03/27/15   Gwyneth Sprout, MD  naproxen (NAPROSYN) 500 MG tablet Take 1 tablet (500 mg total) by mouth 2 (two) times daily. 03/27/15   Gwyneth Sprout, MD  sertraline (ZOLOFT) 100 MG tablet Take 100 mg by mouth daily.    [provider]    Family History No family history on file.  Social History Social History   Tobacco Use  . Smoking status: Current Every Day Smoker    Packs/day: 1.00  . Smokeless tobacco: Current User  Substance Use Topics  . Alcohol use: Yes    Comment: social   . Drug use: Yes    Types: Marijuana    Comment: daily     Allergies   Patient has no known allergies.   Review of Systems Review of Systems  Constitutional: Negative for fatigue.  Eyes: Positive for pain and redness. Negative for discharge and visual disturbance.  Respiratory: Negative for shortness of breath.   Cardiovascular: Negative for chest pain.  Gastrointestinal: Negative for abdominal pain.  Neurological: Negative for headaches.  All other systems reviewed and are negative.    Physical Exam Updated Vital Signs BP 129/79 (BP Location: Right Arm)   Pulse 84   Temp 97.7 F (36.5 C) (Oral)   Resp 18   Ht  (1.803 m)   Wt 121.6 kg (268 lb)   SpO2 98%   BMI 37.38 kg/m   Physical Exam  Constitutional: He is oriented to person,  place, and time. He appears well-developed and well-nourished.  HENT:  Head: Normocephalic and atraumatic.  Eyes: Pupils are equal, round, and reactive to light. EOM are normal.  Slit lamp exam:      The right eye shows corneal abrasion.    Neck: Normal range of motion. Neck supple.  Cardiovascular: Normal rate, regular rhythm and normal heart sounds.  Pulmonary/Chest: Effort normal and breath sounds normal. No stridor. No respiratory distress. He has no wheezes.  Abdominal: Soft. Bowel sounds are normal. There is no tenderness.  Musculoskeletal: Normal range of motion.  Neurological: He is alert and oriented to person, place, and time.  Skin: Skin  is warm and dry. Capillary refill takes less than 2 seconds.  Psychiatric: He has a normal mood and affect.     ED Treatments / Results   Procedures Procedures (including critical care time)  Medications Ordered in ED Medications  fluorescein ophthalmic strip 1 strip (1 strip Left Eye Given 11/29/17 0312)  tetracaine (PONTOCAINE) 0.5 % ophthalmic solution 2 drop (2 drops Right Eye Given 11/29/17 1478)  acetaminophen (TYLENOL) tablet 1,000 mg (1,000 mg Oral Given 11/29/17 0313)       Final Clinical Impressions(s) / ED Diagnoses   Final diagnoses:  Corneal abrasion, unspecified laterality, initial encounter   Return for weakness, numbness, changes in vision or speech, fevers >100.4 unrelieved by medication, shortness of breath, intractable vomiting, or diarrhea, abdominal pain, Inability to tolerate liquids or food, cough, altered mental status or any concerns. No signs of systemic illness or infection. The patient is nontoxic-appearing on exam and vital signs are within normal limits.   I have reviewed the triage vital signs and the nursing notes. Pertinent labs &imaging results that were available during my care of the patient were reviewed by me and considered in my medical decision making (see chart for details).  After history, exam, and medical workup I feel the patient has been appropriately medically screened and is safe for discharge home. Pertinent diagnoses were discussed with the patient. Patient was given return precautions.  ED Discharge Orders        Ordered    ciprofloxacin (CILOXAN) 0.3 % ophthalmic solution  Every 4 hours while awake     11/29/17 0321       Adreena Willits, MD 11/29/17 2956

## 2017-11-29 NOTE — ED Triage Notes (Signed)
Pt c/o right eye 8/10 pain since yesterday at lunch time. Pt's right eye looks red and swollen, pt denies any injury.

## 2019-07-30 ENCOUNTER — Ambulatory Visit: Payer: PRIVATE HEALTH INSURANCE | Attending: Internal Medicine

## 2019-07-30 DIAGNOSIS — Z20822 Contact with and (suspected) exposure to covid-19: Secondary | ICD-10-CM

## 2019-08-01 LAB — NOVEL CORONAVIRUS, NAA: SARS-CoV-2, NAA: NOT DETECTED

## 2020-04-28 ENCOUNTER — Other Ambulatory Visit: Payer: Self-pay

## 2020-04-28 ENCOUNTER — Other Ambulatory Visit: Payer: PRIVATE HEALTH INSURANCE

## 2020-04-28 DIAGNOSIS — Z20822 Contact with and (suspected) exposure to covid-19: Secondary | ICD-10-CM

## 2020-04-29 LAB — NOVEL CORONAVIRUS, NAA: SARS-CoV-2, NAA: NOT DETECTED

## 2020-04-29 LAB — SARS-COV-2, NAA 2 DAY TAT
